# Patient Record
Sex: Female | Born: 1990 | Race: Black or African American | Hispanic: No | Marital: Single | Smoking: Never smoker
Health system: Southern US, Community
[De-identification: ages and names within clinical notes are randomized; demographics above are authoritative.]

## PROBLEM LIST (undated history)

## (undated) DIAGNOSIS — J45909 Unspecified asthma, uncomplicated: Secondary | ICD-10-CM

## (undated) DIAGNOSIS — J069 Acute upper respiratory infection, unspecified: Secondary | ICD-10-CM

## (undated) DIAGNOSIS — L309 Dermatitis, unspecified: Secondary | ICD-10-CM

## (undated) HISTORY — PX: TOE SURGERY: SHX1073

## (undated) HISTORY — DX: Dermatitis, unspecified: L30.9

## (undated) HISTORY — DX: Acute upper respiratory infection, unspecified: J06.9

## (undated) HISTORY — DX: Unspecified asthma, uncomplicated: J45.909

---

## 2011-10-19 HISTORY — PX: ACHILLES TENDON REPAIR: SUR1153

## 2011-11-15 ENCOUNTER — Emergency Department (HOSPITAL_COMMUNITY): Payer: BC Managed Care – PPO

## 2011-11-15 ENCOUNTER — Emergency Department (HOSPITAL_COMMUNITY)
Admission: EM | Admit: 2011-11-15 | Discharge: 2011-11-15 | Disposition: A | Discharge status: Home / Self Care | Payer: BC Managed Care – PPO | Attending: Emergency Medicine | Admitting: Emergency Medicine

## 2011-11-15 ENCOUNTER — Encounter (HOSPITAL_COMMUNITY): Payer: Self-pay | Admitting: *Deleted

## 2011-11-15 DIAGNOSIS — Y9367 Activity, basketball: Secondary | ICD-10-CM | POA: Insufficient documentation

## 2011-11-15 DIAGNOSIS — S99929A Unspecified injury of unspecified foot, initial encounter: Secondary | ICD-10-CM | POA: Insufficient documentation

## 2011-11-15 DIAGNOSIS — R269 Unspecified abnormalities of gait and mobility: Secondary | ICD-10-CM | POA: Insufficient documentation

## 2011-11-15 DIAGNOSIS — M25579 Pain in unspecified ankle and joints of unspecified foot: Secondary | ICD-10-CM | POA: Insufficient documentation

## 2011-11-15 DIAGNOSIS — Y9239 Other specified sports and athletic area as the place of occurrence of the external cause: Secondary | ICD-10-CM | POA: Insufficient documentation

## 2011-11-15 DIAGNOSIS — IMO0001 Reserved for inherently not codable concepts without codable children: Secondary | ICD-10-CM | POA: Insufficient documentation

## 2011-11-15 DIAGNOSIS — S8990XA Unspecified injury of unspecified lower leg, initial encounter: Secondary | ICD-10-CM | POA: Insufficient documentation

## 2011-11-15 DIAGNOSIS — M79669 Pain in unspecified lower leg: Secondary | ICD-10-CM

## 2011-11-15 DIAGNOSIS — M79609 Pain in unspecified limb: Secondary | ICD-10-CM | POA: Insufficient documentation

## 2011-11-15 DIAGNOSIS — X500XXA Overexertion from strenuous movement or load, initial encounter: Secondary | ICD-10-CM | POA: Insufficient documentation

## 2011-11-15 DIAGNOSIS — Y92838 Other recreation area as the place of occurrence of the external cause: Secondary | ICD-10-CM | POA: Insufficient documentation

## 2011-11-15 MED ORDER — HYDROCODONE-ACETAMINOPHEN 5-325 MG PO TABS: 1.0000 | ORAL_TABLET | ORAL | Status: AC | PRN

## 2011-11-15 MED ORDER — HYDROCODONE-ACETAMINOPHEN 5-325 MG PO TABS
1.0000 | ORAL_TABLET | Freq: Once | ORAL | Status: AC
  Administered 2011-11-15: 1 via ORAL
  Filled 2011-11-15: qty 1

## 2011-11-15 MED ORDER — IBUPROFEN 800 MG PO TABS: 800.0000 mg | ORAL_TABLET | Freq: Three times a day (TID) | ORAL | Status: AC

## 2011-11-15 NOTE — ED Notes (Signed)
The pt was playing basketball and she went up for a lay -up and she felt something pop in her lt heel.  Pain and soreness since then

## 2011-11-15 NOTE — ED Provider Notes (Signed)
History     CSN: [id number]  Arrival date & time 11/15/11  2047   First MD Initiated Contact with Patient 11/15/11 2158      Chief Complaint  Patient presents with  . Foot Injury    (Consider location/radiation/quality/duration/timing/severity/associated sxs/prior treatment) Patient is a 21 y.o. female presenting with leg pain. The history is provided by the patient.  Leg Pain  The incident occurred less than 1 hour ago. The incident occurred at the gym. The injury mechanism was a fall. The pain is present in the left leg and left ankle. The quality of the pain is described as sharp and throbbing. Associated symptoms include inability to bear weight. Pertinent negatives include no numbness, no loss of sensation and no tingling.   Pt was playing basketball when she came down on her L foot. She had sudden sharp pain in her L heel but is unsure if she may have gotten kicked in the back of the leg. Has had significant pain to the leg since that time and pain with wt bearing. Feels unable to walk. Denies numbness, tingling, swelling.  History reviewed. No pertinent past medical history.  History reviewed. No pertinent past surgical history.  History reviewed. No pertinent family history.  History  Substance Use Topics  . Smoking status: Never Smoker   . Smokeless tobacco: Not on file  . Alcohol Use: Yes    OB History    Grav Para Term Preterm Abortions TAB SAB Ect Mult Living                  Review of Systems  Constitutional: Negative.   Musculoskeletal: Positive for myalgias and gait problem. Negative for joint swelling.  Skin: Negative for color change.  Neurological: Negative for tingling, weakness and numbness.    Allergies  Review of patient's allergies indicates no known allergies.  Home Medications  No current outpatient prescriptions on file.  BP 134/84  Pulse 81  Temp(Src) 98.4 F (36.9 C) (Oral)  Resp 19  SpO2 100%  LMP 10/15/2011  Physical Exam    Nursing note and vitals reviewed. Constitutional: She is oriented to person, place, and time. She appears well-developed and well-nourished. No distress.  HENT:  Head: Normocephalic and atraumatic.  Musculoskeletal:       Left lower leg: She exhibits tenderness. She exhibits no bony tenderness, no swelling, no edema and no deformity.       Left foot: She exhibits normal range of motion, no tenderness, no bony tenderness, no swelling, normal capillary refill and no deformity.       Negative Thompson test. Achilles intact to palpation. Tender to palp over calf area. No palpable mass/deformity. Pt with reduced plantar flexion of foot although able to do so. Foot neurovasc intact with sensory intact to lt touch. Good DP/PT pulses. Cap refill <3.  Neurological: She is alert and oriented to person, place, and time.  Skin: Skin is warm and dry. She is not diaphoretic.  Psychiatric: She has a normal mood and affect.    ED Course  Procedures (including critical care time)  Labs Reviewed - No data to display Dg Tibia/fibula Left  11/15/2011  *RADIOLOGY REPORT*  Clinical Data: Calf muscle pain radiating down the leg after basketball injury.  LEFT TIBIA AND FIBULA - 2 VIEW  Comparison: Correlation with ankle films obtained at the same time.  Findings: The left tibia and fibula appear intact.  No focal bone lesion or bone destruction.  No evidence of acute  fracture or subluxation.  Bone cortex and trabecular architecture appear intact.  No abnormal radiopaque densities in the soft tissues.  IMPRESSION: No acute bony abnormalities demonstrated.  Original Report Authenticated By: Marlon Pel, M.D.   Dg Ankle Complete Left  11/15/2011  *RADIOLOGY REPORT*  Clinical Data: Left ankle pain and difficulty bearing weight after basketball injury.  LEFT ANKLE COMPLETE - 3+ VIEW  Comparison: None.  Findings: The left ankle appears intact. No evidence of acute fracture or subluxation.  No focal bone lesions.   Bone matrix and cortex appear intact.  No abnormal radiopaque densities in the soft tissues.  Talar dome and ankle mortise appear intact.  IMPRESSION: No acute bony abnormalities identified.  Original Report Authenticated By: Marlon Pel, M.D.  I personally reviewed the plain films.   1. Calf pain       MDM  Pt with pain to calf and ankle since jumping and landing while playing basketball. No palpable deformity and Achilles appears intact on exam. Pt given crutches, ASO and advised to non wt bear for next several days. Advised RICE. Given pain medication. Encouraged to make f/u with ortho for a recheck. Return precautions discussed.        Grant Fontana, [place] 11/16/11 [phone]

## 2011-11-17 NOTE — ED Provider Notes (Signed)
Medical screening examination/treatment/procedure(s) were performed by non-physician practitioner and as supervising physician I was immediately available for consultation/collaboration.   Kalab Camps L Richrd Kuzniar, MD 11/17/11 2305 

## 2012-02-23 IMAGING — CR DG TIBIA/FIBULA 2V*L*
2 series · 2 of 2 positions shown · non-contrast
Comparison: Correlation with ankle films obtained at the same time.

CLINICAL DATA: Calf muscle pain radiating down the leg after
basketball injury.

LEFT TIBIA AND FIBULA - 2 VIEW

[x tib-fib lat left]
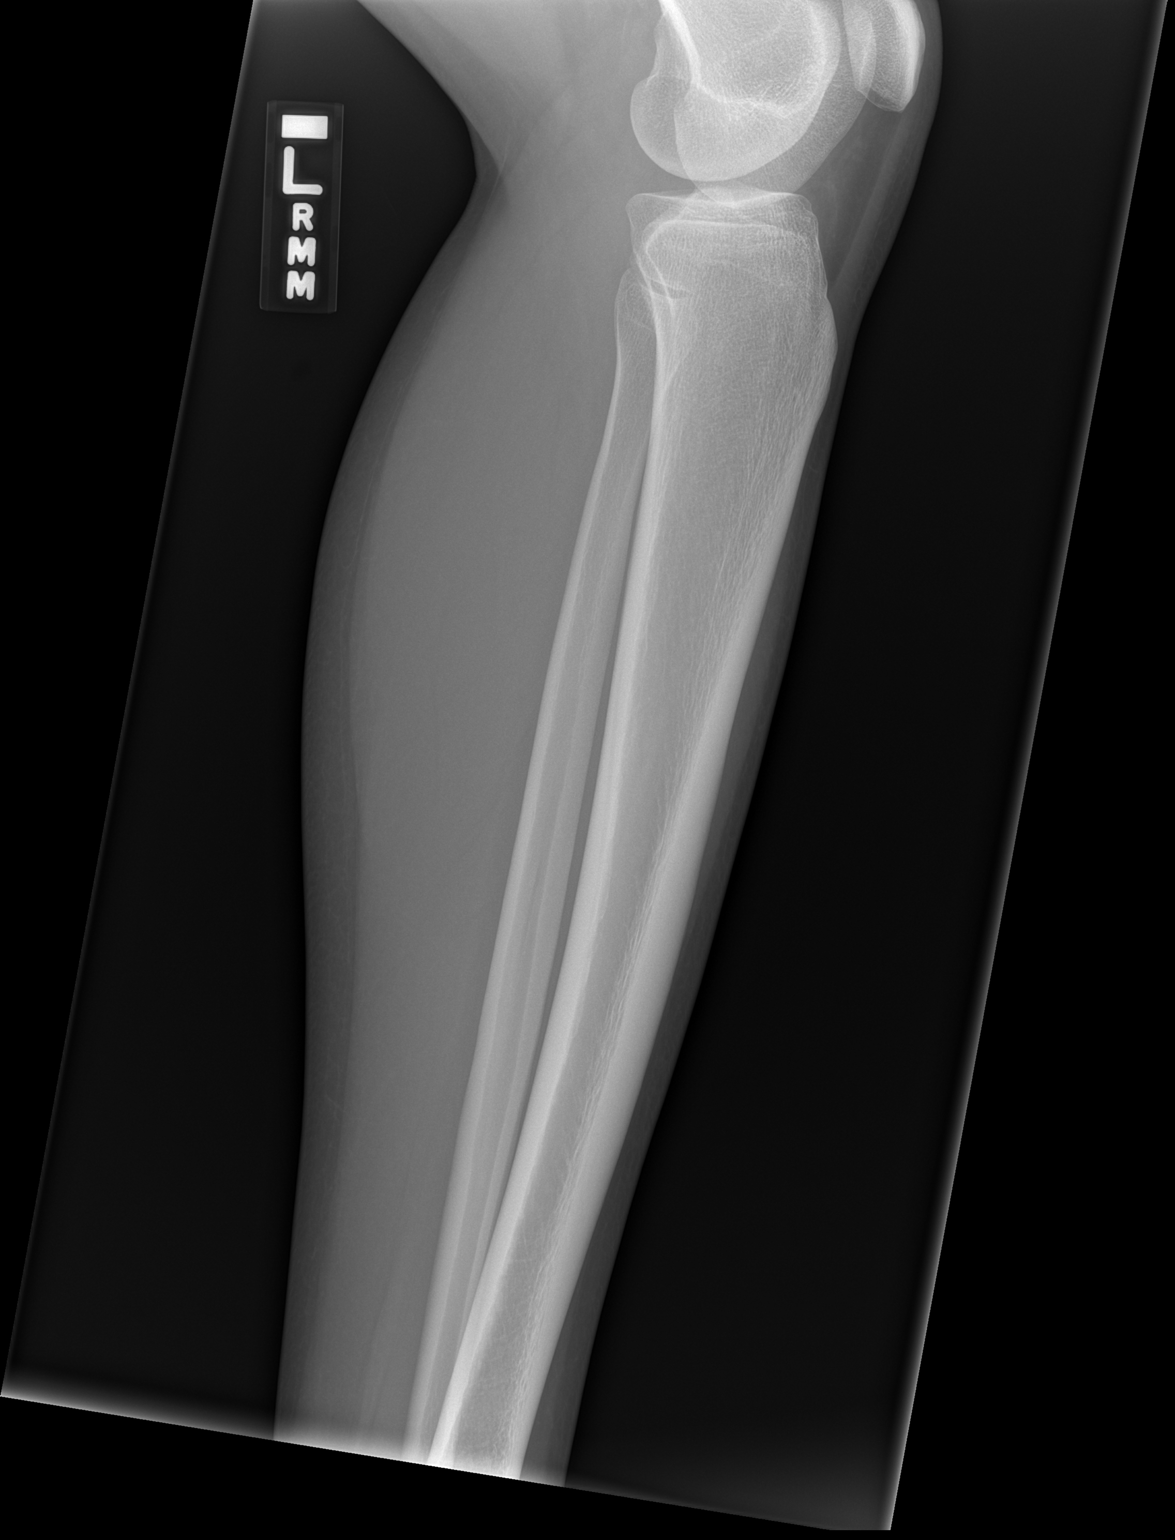

[x tib-fib ap left]
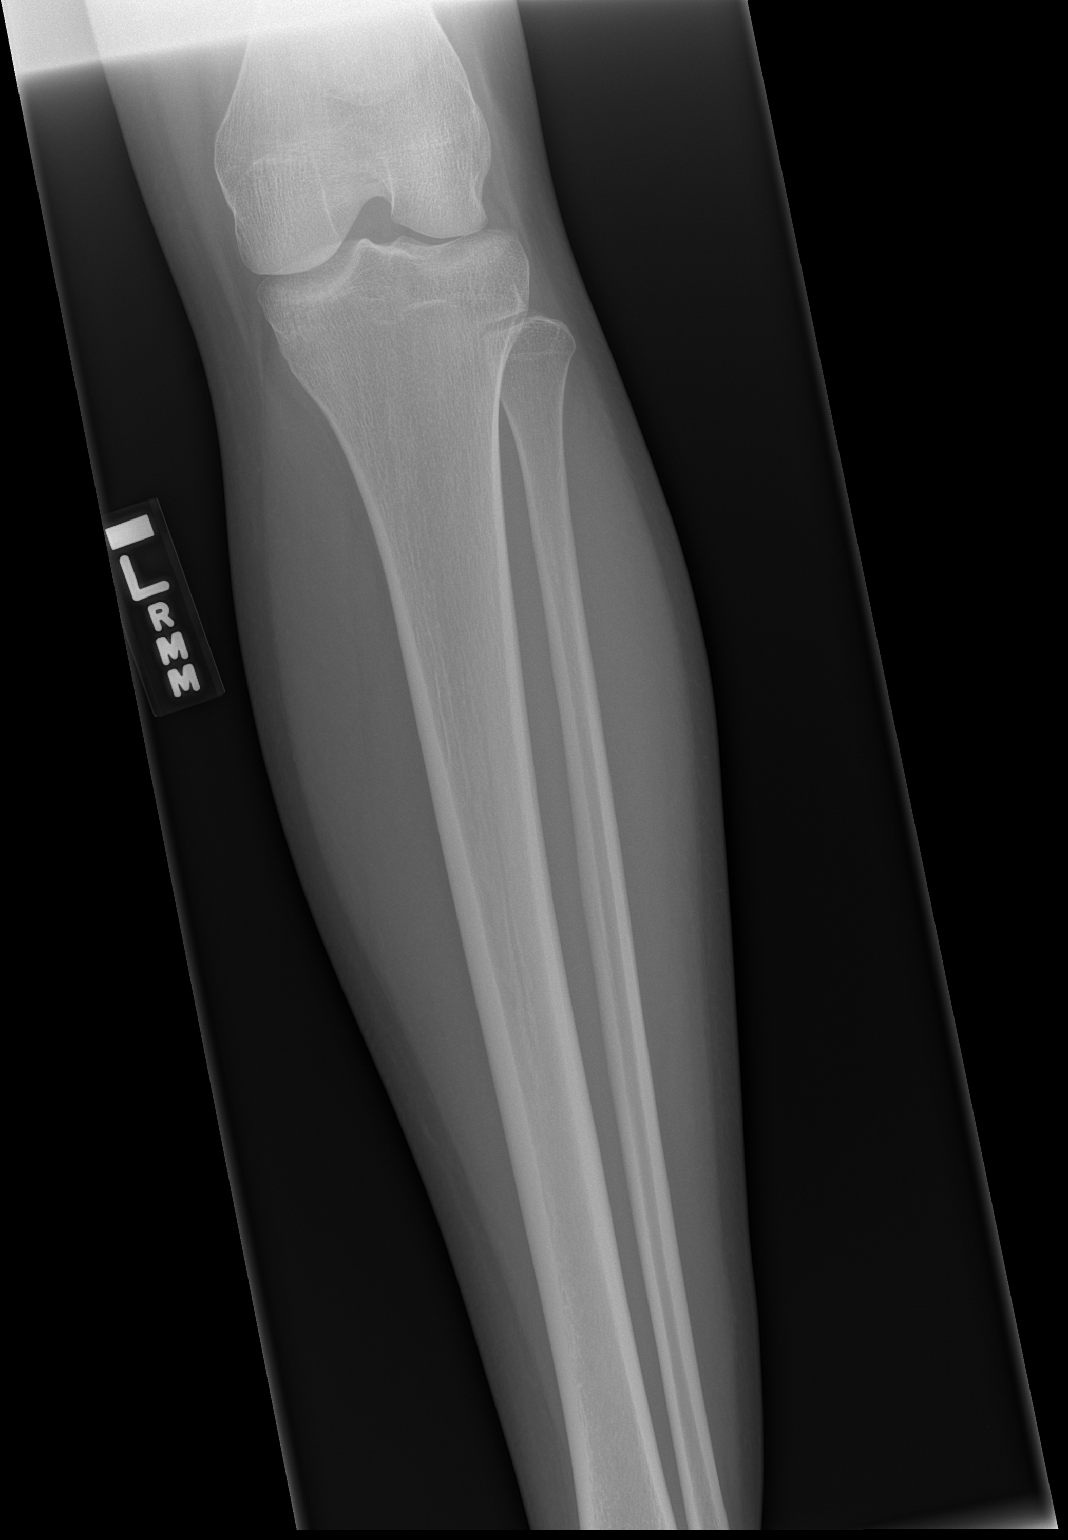

[2 of 2 positions shown; findings below may reference images not displayed]

FINDINGS: The left tibia and fibula appear intact.  No focal bone
lesion or bone destruction.  No evidence of acute fracture or
subluxation.  Bone cortex and trabecular architecture appear
intact.  No abnormal radiopaque densities in the soft tissues.
IMPRESSION: No acute bony abnormalities demonstrated.

## 2013-01-05 ENCOUNTER — Emergency Department (HOSPITAL_COMMUNITY)
Admission: EM | Admit: 2013-01-05 | Discharge: 2013-01-05 | Disposition: A | Discharge status: Home / Self Care | Payer: BC Managed Care – PPO | Source: Home / Self Care | Attending: Emergency Medicine | Admitting: Emergency Medicine

## 2013-01-05 ENCOUNTER — Encounter (HOSPITAL_COMMUNITY): Payer: Self-pay | Admitting: Emergency Medicine

## 2013-01-05 DIAGNOSIS — J039 Acute tonsillitis, unspecified: Secondary | ICD-10-CM

## 2013-01-05 MED ORDER — CEFTRIAXONE SODIUM 1 G IJ SOLR
1.0000 g | Freq: Once | INTRAMUSCULAR | Status: AC
  Administered 2013-01-05: 1 g via INTRAMUSCULAR

## 2013-01-05 MED ORDER — PREDNISONE 20 MG PO TABS
40.0000 mg | ORAL_TABLET | Freq: Once | ORAL | Status: AC
  Administered 2013-01-05: 40 mg via ORAL

## 2013-01-05 MED ORDER — LIDOCAINE HCL (PF) 1 % IJ SOLN
INTRAMUSCULAR | Status: AC
  Filled 2013-01-05: qty 5

## 2013-01-05 MED ORDER — CEFTRIAXONE SODIUM 250 MG IJ SOLR
INTRAMUSCULAR | Status: AC
  Filled 2013-01-05: qty 250

## 2013-01-05 MED ORDER — AMOXICILLIN 875 MG PO TABS: 875.0000 mg | ORAL_TABLET | Freq: Two times a day (BID) | ORAL | Status: DC

## 2013-01-05 MED ORDER — PREDNISONE 20 MG PO TABS
ORAL_TABLET | ORAL | Status: AC
  Filled 2013-01-05: qty 2

## 2013-01-05 NOTE — ED Notes (Signed)
Pt c/o sore throat since yesterday and is gradually getting worse. And is experiencing fatigue.  Pt states that she noticed a white patch on left tonsil x today.  Pt denies any other symptoms. Pt has used cough drops peroxide gargles, salt water and tylenol with mild relief.

## 2013-01-05 NOTE — ED Provider Notes (Signed)
History     CSN: [id number]  Arrival date & time 01/05/13  1911   First MD Initiated Contact with Patient 01/05/13 2037      Chief Complaint  Patient presents with  . Sore Throat    since yesterday    (Consider location/radiation/quality/duration/timing/severity/associated sxs/prior treatment) Patient is a 22 y.o. female presenting with pharyngitis. The history is provided by the patient.  Sore Throat This is a new problem. The current episode started yesterday. The problem occurs constantly. The problem has been gradually worsening. The symptoms are aggravated by swallowing. Nothing relieves the symptoms. Treatments tried: tylenol, peroxide gargles, cough drops. The treatment provided mild relief.    History reviewed. No pertinent past medical history.  History reviewed. No pertinent past surgical history.  History reviewed. No pertinent family history.  History  Substance Use Topics  . Smoking status: Never Smoker   . Smokeless tobacco: Not on file  . Alcohol Use: Yes    OB History   Grav Para Term Preterm Abortions TAB SAB Ect Mult Living                  Review of Systems  Constitutional: Negative for fever and chills.  HENT: Positive for sore throat. Negative for drooling and trouble swallowing.   Respiratory: Negative for stridor.     Allergies  Review of patient's allergies indicates no known allergies.  Home Medications   Current Outpatient Rx  Name  Route  Sig  Dispense  Refill  . amoxicillin (AMOXIL) 875 MG tablet   Oral   Take 1 tablet (875 mg total) by mouth 2 (two) times daily.   20 tablet   0     BP 112/76  Pulse 90  Temp(Src) 98.5 F (36.9 C) (Oral)  Resp 18  SpO2 100%  LMP 12/25/2012  Physical Exam  Constitutional: She appears well-developed and well-nourished. She does not appear ill. No distress.  HENT:  Right Ear: Tympanic membrane, external ear and ear canal normal.  Left Ear: Tympanic membrane, external ear and ear canal  normal.  Nose: Right sinus exhibits no maxillary sinus tenderness and no frontal sinus tenderness. Left sinus exhibits no maxillary sinus tenderness and no frontal sinus tenderness.  Mouth/Throat:    Posterior pharynx not inflamed, no exudate.   Cardiovascular: Normal rate and regular rhythm.   Pulmonary/Chest: Effort normal and breath sounds normal.  Lymphadenopathy:       Head (right side): No submental, no submandibular and no tonsillar adenopathy present.       Head (left side): No submental, no submandibular and no tonsillar adenopathy present.    ED Course  Procedures (including critical care time)  Labs Reviewed  POCT RAPID STREP A ([facility] URG CARE ONLY)   No results found.   1. Tonsillitis       MDM  Dr. Ladon Applebaum examined pt with me.  Concern for possible early peritonsillar abscess.  Given rocephin IM and prednisone here tonight, rx amox.  Pt to return 3/23 for recheck or to ER if sx become severe.         Cathlyn Parsons, NP 01/05/13 2053

## 2013-01-06 NOTE — ED Provider Notes (Signed)
Medical screening examination/treatment/procedure(s) were performed by non-physician practitioner and as supervising physician I was immediately available for consultation/collaboration. Examined the patient myself, symptomatic for less than 24 hours afebrile at this point swallowing with pain but without obstructive or mechanical difficulties. Patient will be rechecked in the next 24-48 hours. I informed patient that was to be here both Saturday and Sunday for recheck was also advised about specific symptoms that should warrant her to go to the emergency department for further evaluation. At this point patient will be followed up closely for the suspicion of a potential early peritonsillar process.  Raynald Blend, MD 01/06/13 1056

## 2013-06-07 ENCOUNTER — Other Ambulatory Visit (HOSPITAL_COMMUNITY)
Admission: RE | Admit: 2013-06-07 | Discharge: 2013-06-07 | Disposition: A | Discharge status: Home / Self Care | Payer: BC Managed Care – PPO | Source: Ambulatory Visit | Attending: Obstetrics and Gynecology | Admitting: Obstetrics and Gynecology

## 2013-06-07 ENCOUNTER — Other Ambulatory Visit: Payer: Self-pay | Admitting: Nurse Practitioner

## 2013-06-07 DIAGNOSIS — Z01419 Encounter for gynecological examination (general) (routine) without abnormal findings: Secondary | ICD-10-CM | POA: Insufficient documentation

## 2013-06-07 DIAGNOSIS — N760 Acute vaginitis: Secondary | ICD-10-CM | POA: Insufficient documentation

## 2013-06-07 DIAGNOSIS — Z113 Encounter for screening for infections with a predominantly sexual mode of transmission: Secondary | ICD-10-CM | POA: Insufficient documentation

## 2014-04-25 ENCOUNTER — Encounter: Payer: Self-pay | Admitting: Podiatry

## 2014-04-25 ENCOUNTER — Ambulatory Visit (INDEPENDENT_AMBULATORY_CARE_PROVIDER_SITE_OTHER): Payer: BC Managed Care – PPO | Admitting: Podiatry

## 2014-04-25 ENCOUNTER — Ambulatory Visit (INDEPENDENT_AMBULATORY_CARE_PROVIDER_SITE_OTHER): Payer: BC Managed Care – PPO

## 2014-04-25 VITALS — BP 115/72 | HR 79 | Resp 16 | Ht 68.0 in | Wt 130.0 lb

## 2014-04-25 DIAGNOSIS — M2010 Hallux valgus (acquired), unspecified foot: Secondary | ICD-10-CM

## 2014-04-25 DIAGNOSIS — M2040 Other hammer toe(s) (acquired), unspecified foot: Secondary | ICD-10-CM

## 2014-04-25 NOTE — Progress Notes (Signed)
   Subjective:    Patient ID: Leah ReamQuanishia Castillo, female    DOB: 11/20/1990, 23 y.o.   MRN: [id number]  HPI Comments: "I have problems with these feet"  Patient c/o tenderness 2nd, 3rd, 4th, 5th toes bilateral for few years. She has been active in basketball for several years. She is developing corns on her toes. Her shoes rub. She is also developing bunions that sometimes look red.  Toe Pain       Review of Systems  Musculoskeletal: Positive for myalgias.  All other systems reviewed and are negative.      Objective:   Physical Exam: I have reviewed her past medical history medications allergies surgeries social history review systems. Pulses are strongly palpable bilateral. Neurologic sensorium is intact per Semmes-Weinstein monofilament. Deep tendon reflexes are brisk and equal bilateral. Muscle strength is 5 over 5 dorsiflexors plantar flexors inverters everters all intrinsic musculature is intact. Orthopedic evaluation demonstrates all joints distal to the ankle a full range of motion without crepitation with exception of her mallet toe deformities toes 2 through 5 bilaterally. She also demonstrates mild hallux valgus deformities bilateral. Radiographic evaluation confirms mallet toe deformities. It also confirms increase in the first intermetatarsal angle. Cutaneous evaluation demonstrates supple well hydrated cutis with exception of reactive postinflammatory hyperpigmentation of the DIPJ bilateral lesser digits.        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  Plan: We discussed the etiology pathology conservative versus surgical therapies. We also discussed appropriate shoe gear stretching exercises ice therapy shoe gear modifications. We also discussed in detail today surgical intervention consisting of arthroplasty to the DIPJ his and hallux abductovalgus repair such as an [employer] bunion  repair. I expressed to her that he would be least 2 weeks out of work for foot for her type of job. I will followup with her in the near future for surgical consult.

## 2014-08-01 ENCOUNTER — Encounter: Payer: Self-pay | Admitting: Podiatry

## 2014-08-01 ENCOUNTER — Ambulatory Visit (INDEPENDENT_AMBULATORY_CARE_PROVIDER_SITE_OTHER): Payer: BC Managed Care – PPO | Admitting: Podiatry

## 2014-08-01 VITALS — BP 112/65 | HR 66 | Resp 16

## 2014-08-01 DIAGNOSIS — M2042 Other hammer toe(s) (acquired), left foot: Secondary | ICD-10-CM

## 2014-08-01 DIAGNOSIS — M2012 Hallux valgus (acquired), left foot: Secondary | ICD-10-CM

## 2014-08-01 NOTE — Patient Instructions (Signed)
Pre-Operative Instructions  Congratulations, you have decided to take an important step to improving your quality of life.  You can be assured that the doctors of [facility] will be with you every step of the way.  1. Plan to be at the surgery center/hospital at least 1 (one) hour prior to your scheduled time unless otherwise directed by the surgical center/hospital staff.  You must have a responsible adult accompany you, remain during the surgery and drive you home.  Make sure you have directions to the surgical center/hospital and know how to get there on time. 2. For hospital based surgery you will need to obtain a history and physical form from your family physician within 1 month prior to the date of surgery- we will give you a form for you primary physician.  3. We make every effort to accommodate the date you request for surgery.  There are however, times where surgery dates or times have to be moved.  We will contact you as soon as possible if a change in schedule is required.   4. No Aspirin/Ibuprofen for one week before surgery.  If you are on aspirin, any non-steroidal anti-inflammatory medications (Mobic, Aleve, Ibuprofen) you should stop taking it 7 days prior to your surgery.  You make take Tylenol  For pain prior to surgery.  5. Medications- If you are taking daily heart and blood pressure medications, seizure, reflux, allergy, asthma, anxiety, pain or diabetes medications, make sure the surgery center/hospital is aware before the day of surgery so they may notify you which medications to take or avoid the day of surgery. 6. No food or drink after midnight the night before surgery unless directed otherwise by surgical center/hospital staff. 7. No alcoholic beverages 24 hours prior to surgery.  No smoking 24 hours prior to or 24 hours after surgery. 8. Wear loose pants or shorts- loose enough to fit over bandages, boots, and casts. 9. No slip on shoes, sneakers are best. 10. Bring  your boot with you to the surgery center/hospital.  Also bring crutches or a walker if your physician has prescribed it for you.  If you do not have this equipment, it will be provided for you after surgery. 11. If you have not been contracted by the surgery center/hospital by the day before your surgery, call to confirm the date and time of your surgery. 12. Leave-time from work may vary depending on the type of surgery you have.  Appropriate arrangements should be made prior to surgery with your employer. 13. Prescriptions will be provided immediately following surgery by your doctor.  Have these filled as soon as possible after surgery and take the medication as directed. 14. Remove nail polish on the operative foot. 15. Wash the night before surgery.  The night before surgery wash the foot and leg well with the antibacterial soap provided and water paying special attention to beneath the toenails and in between the toes.  Rinse thoroughly with water and dry well with a towel.  Perform this wash unless told not to do so by your physician.  Enclosed: 1 Ice pack (please put in freezer the night before surgery)   1 Hibiclens skin cleaner   Pre-op Instructions  If you have any questions regarding the instructions, do not hesitate to call our office.  [place]: [address] [place], [place] [address] [phone]  [place]: [address] [place], [place] [address] [phone]  [place]: [address]  [place], [place] [address] [phone]  Dr. Richard   Tuchman DPM, Dr. Norman Regal DPM Dr. Richard Sikora DPM, Dr. M. Todd Hyatt DPM, Dr. Kathryn Egerton DPM 

## 2014-08-02 NOTE — Progress Notes (Signed)
She presents today for followup of her painful first metatarsophalangeal joint and toes of her left foot. I have reviewed her past medical history medications and allergies she denies denies any changes in her medical history. She states that the painful bunion and hammertoes are directly affecting her ability to perform her daily activities.  Objective: Vital signs are stable she is alert and oriented x3. There is no erythema edema cellulitis drainage or odor only a prominent first metatarsophalangeal joint which is painful on range of motion. An increase in the first intermetatarsal angle is indicative of abductovalgus deformity with lateral deviation of the hallux greater than normal value. She also has mallet toe deformities of the second toe third toe and fourth toes of the left foot. These are painful on range of motion and or more rigid than we would expect for 23 year old female.  Assessment: Hallux valgus left foot. Mallet toe deformity #2 #3 #4 left foot.  Plan: We discussed the etiology pathology conservative versus surgical therapies at this point failure of conservative therapies have resulted in the necessity for surgical intervention. At this point surgical intervention will consist of [facility] with screw fixation left foot. DIPJ arthroplasty toes #2 #3 and #4 of the left foot. We consented her for this today and this was discussed in detail I went over consent today line bylined number by number giving her ample time to ask questions she saw fit regarding these procedures I answered them to the best of my ability in layman's terms. She understood this was amenable to it and signed all 3 pages of the consent form. This will be performed in an outpatient surgical setting in the near future. I will followup with her at that time. We also discussed the possible postop complications which may be but are not limited to postop pain bleeding swelling infection possible digit loss of limb  loss of life over correction under correction need for further surgery.

## 2014-09-03 ENCOUNTER — Other Ambulatory Visit: Payer: Self-pay | Admitting: Podiatry

## 2014-09-03 MED ORDER — PROMETHAZINE HCL 25 MG PO TABS: 25.0000 mg | ORAL_TABLET | Freq: Three times a day (TID) | ORAL | Status: AC | PRN

## 2014-09-03 MED ORDER — OXYCODONE-ACETAMINOPHEN 10-325 MG PO TABS: ORAL_TABLET | ORAL | Status: AC

## 2014-09-03 MED ORDER — CEPHALEXIN 500 MG PO CAPS: 500.0000 mg | ORAL_CAPSULE | Freq: Three times a day (TID) | ORAL | Status: AC

## 2014-09-06 ENCOUNTER — Encounter: Payer: Self-pay | Admitting: Podiatry

## 2014-09-06 DIAGNOSIS — M2012 Hallux valgus (acquired), left foot: Secondary | ICD-10-CM

## 2014-09-06 DIAGNOSIS — M2042 Other hammer toe(s) (acquired), left foot: Secondary | ICD-10-CM

## 2014-09-09 ENCOUNTER — Telehealth: Payer: Self-pay

## 2014-09-09 NOTE — Telephone Encounter (Signed)
Left message for patient to cal with questions or concerns

## 2014-09-10 ENCOUNTER — Encounter: Payer: BC Managed Care – PPO | Admitting: Podiatry

## 2014-09-10 NOTE — Progress Notes (Signed)
Dr Al CorpusHyatt [facility] and hammertoe repair of left 2,3,4 metatarsals on 09/06/14

## 2014-09-17 ENCOUNTER — Ambulatory Visit (INDEPENDENT_AMBULATORY_CARE_PROVIDER_SITE_OTHER): Payer: BC Managed Care – PPO | Admitting: Podiatry

## 2014-09-17 ENCOUNTER — Ambulatory Visit (INDEPENDENT_AMBULATORY_CARE_PROVIDER_SITE_OTHER): Payer: BC Managed Care – PPO

## 2014-09-17 ENCOUNTER — Encounter: Payer: Self-pay | Admitting: Podiatry

## 2014-09-17 VITALS — BP 117/79 | HR 76 | Resp 12

## 2014-09-17 DIAGNOSIS — R52 Pain, unspecified: Secondary | ICD-10-CM

## 2014-09-17 DIAGNOSIS — Z9889 Other specified postprocedural states: Secondary | ICD-10-CM

## 2014-09-17 NOTE — Progress Notes (Signed)
She presents today status post [place] bunion repair left with hammertoe repair #2 #3 number for the left foot she denies fever chills nausea vomiting muscle pains. 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  Objective: Pulses are strongly palpable as a dry sterile dressing was removed there is minimal edema and no erythema cellulitis drainage or odor sutures are intact [employer]a [employer] is remaining well coapted radiographic evaluation demonstrates well-healing capital osteotomy first metatarsal left foot with arthroplasties DIPJ #2 #3 #4. There is no erythema edema saline as drainage or odor everything is in good alignment.  Assessment: Nonsurgical foot status post one week.  Plan: Redress today dressing compressive dressing follow-up with her in 1 week reevaluate x-rays on the right foot and consent her for surgery.

## 2014-09-24 ENCOUNTER — Ambulatory Visit (INDEPENDENT_AMBULATORY_CARE_PROVIDER_SITE_OTHER): Payer: BC Managed Care – PPO | Admitting: Podiatry

## 2014-09-24 VITALS — BP 111/65 | HR 67 | Temp 98.7°F | Resp 16

## 2014-09-24 DIAGNOSIS — Z9889 Other specified postprocedural states: Secondary | ICD-10-CM

## 2014-09-24 DIAGNOSIS — M2011 Hallux valgus (acquired), right foot: Secondary | ICD-10-CM

## 2014-09-24 DIAGNOSIS — M205X1 Other deformities of toe(s) (acquired), right foot: Secondary | ICD-10-CM

## 2014-09-24 DIAGNOSIS — M2041 Other hammer toe(s) (acquired), right foot: Secondary | ICD-10-CM

## 2014-09-24 NOTE — Progress Notes (Signed)
She presents today little more than 2 weeks status post [place] bunion repair left and hammertoe repair #2 #3 #4 of the left foot. She states they seem to be doing well with the foot is a little tender. She would also like to go ahead and sign surgical consent form for the right foot today.  Objective: Also stable she is alert and oriented 3. His are palpable sutures were removed to the surgical sites of the left foot which she tolerated procedure well. At this point I evaluated the right foot which consists of a mild bunion deformity and mallet toe deformities #2 and #3 in the right foot only.  Assessment: Well-healed surgical foot left 2 weeks. Hallux abductovalgus deformity with mallet toe deformities #2 #3 right foot.  Plan: Removal sutures today dispensed a Darco shoe and an anklet. Demonstrated to her how to wrap the toes with Coban. We also went over a consent form today [employer] line number number giving her ample time to ask questions she suffered regarding a McBride bunion repair right foot and a mallet toe repair #2 #3 of the right foot. She saw another patient's consent form I will follow-up with her 10/04/2014.

## 2014-10-03 DIAGNOSIS — M79673 Pain in unspecified foot: Secondary | ICD-10-CM

## 2014-10-04 ENCOUNTER — Encounter: Payer: Self-pay | Admitting: Podiatry

## 2014-10-04 DIAGNOSIS — M2011 Hallux valgus (acquired), right foot: Secondary | ICD-10-CM

## 2014-10-04 DIAGNOSIS — M2041 Other hammer toe(s) (acquired), right foot: Secondary | ICD-10-CM

## 2014-10-08 ENCOUNTER — Encounter: Payer: BC Managed Care – PPO | Admitting: Podiatry

## 2014-10-08 NOTE — Progress Notes (Signed)
Dr Milinda Pointer performed a rfoot Mcbride bunion repair and right met 2,3 mallet toe repair on 10/04/14

## 2014-10-15 ENCOUNTER — Ambulatory Visit (INDEPENDENT_AMBULATORY_CARE_PROVIDER_SITE_OTHER): Payer: BC Managed Care – PPO | Admitting: Podiatry

## 2014-10-15 ENCOUNTER — Ambulatory Visit: Payer: Self-pay

## 2014-10-15 ENCOUNTER — Ambulatory Visit (INDEPENDENT_AMBULATORY_CARE_PROVIDER_SITE_OTHER): Payer: BC Managed Care – PPO

## 2014-10-15 DIAGNOSIS — M2011 Hallux valgus (acquired), right foot: Secondary | ICD-10-CM

## 2014-10-15 DIAGNOSIS — Z9889 Other specified postprocedural states: Secondary | ICD-10-CM

## 2014-10-16 NOTE — Progress Notes (Signed)
She presents today 1 week status post McBride bunion repair and mallet toe repair #2 and #3 of the right foot. She denies fever chills nausea vomiting muscle pains that she states this one seems to be a little more tender than the previous surgery.  Objective: Vital signs are stable she is alert and oriented 3 dry sterile dressing was removed demonstrates no erythema and no edema no cellulitis drainage or odor sutures are intact margins are well coapted and she has a good range of motion of the first metatarsophalangeal joint. Radiographically this appears to be healing perfectly no signs of fracture.  Assessment: Well-healing surgical foot right.  Plan: Redressed today dry sterile compressive dressing follow-up with me in 1 week for suture removal.

## 2014-10-22 ENCOUNTER — Encounter: Payer: BC Managed Care – PPO | Admitting: Podiatry

## 2014-10-22 ENCOUNTER — Ambulatory Visit (INDEPENDENT_AMBULATORY_CARE_PROVIDER_SITE_OTHER): Payer: BLUE CROSS/BLUE SHIELD

## 2014-10-22 DIAGNOSIS — M2011 Hallux valgus (acquired), right foot: Secondary | ICD-10-CM

## 2014-10-22 DIAGNOSIS — M79673 Pain in unspecified foot: Secondary | ICD-10-CM

## 2014-10-22 NOTE — Progress Notes (Signed)
Sutures were removed, wound edges aligned and approximated. Noted small blister on 2nd met, but no drainage. No redness, mild edema noted, but as to be expected from post operative procedure. Advised pt to watch for s/s of infection, further blistering and any drainage. Appointed pt to follow up with Dr Milinda Pointer in 2 weeks or before if problems occur

## 2014-11-05 ENCOUNTER — Ambulatory Visit (INDEPENDENT_AMBULATORY_CARE_PROVIDER_SITE_OTHER): Payer: BLUE CROSS/BLUE SHIELD | Admitting: Podiatry

## 2014-11-05 ENCOUNTER — Ambulatory Visit (INDEPENDENT_AMBULATORY_CARE_PROVIDER_SITE_OTHER): Payer: BLUE CROSS/BLUE SHIELD

## 2014-11-05 DIAGNOSIS — M21611 Bunion of right foot: Secondary | ICD-10-CM

## 2014-11-05 DIAGNOSIS — M2011 Hallux valgus (acquired), right foot: Secondary | ICD-10-CM

## 2014-11-05 DIAGNOSIS — Z9889 Other specified postprocedural states: Secondary | ICD-10-CM

## 2014-11-05 NOTE — Progress Notes (Signed)
She presents today [facility] right foot. She denies fever chills nausea vomiting muscle aches and pains.  Objective: Vital signs are stable she is alert and oriented 3. Pulses are palpable. The surgical foot appears to be healing quite nicely as does the toes area radiographs confirm this.  Assessment: Well-healed surgical foot right.  Plan: Continue all conservative therapies and continue to dress the toes and regular basis follow-up with her in 2-3 weeks

## 2014-12-03 ENCOUNTER — Ambulatory Visit (INDEPENDENT_AMBULATORY_CARE_PROVIDER_SITE_OTHER): Payer: BLUE CROSS/BLUE SHIELD | Admitting: Podiatry

## 2014-12-03 ENCOUNTER — Ambulatory Visit (INDEPENDENT_AMBULATORY_CARE_PROVIDER_SITE_OTHER): Payer: BLUE CROSS/BLUE SHIELD

## 2014-12-03 DIAGNOSIS — Z9889 Other specified postprocedural states: Secondary | ICD-10-CM

## 2014-12-03 DIAGNOSIS — M21611 Bunion of right foot: Secondary | ICD-10-CM

## 2014-12-03 DIAGNOSIS — M2012 Hallux valgus (acquired), left foot: Secondary | ICD-10-CM

## 2014-12-03 DIAGNOSIS — M2011 Hallux valgus (acquired), right foot: Secondary | ICD-10-CM

## 2014-12-03 DIAGNOSIS — M21612 Bunion of left foot: Secondary | ICD-10-CM

## 2014-12-03 NOTE — Progress Notes (Signed)
She presents today follow-up of her bilateral bunion repairs and hammertoe repairs. She still dressing the toes #2 #3 of the right foot. She states that she's doing just great and has no problems whatsoever.  Objective: Vital signs are stable she is alert and oriented 3. There is no erythema edema cellulitis drainage or odor she has great range of motion of the first metatarsophalangeal joint bilateral. Surgical toes bilaterally appear to be healing very well and this is confirmed by radiographs which demonstrate [place] bunion repairs with complete consolidation of the osteotomy sites.  Assessment: Well-healing surgical sites bilateral.  Plan: I encouraged her to continue to massage and wrapped the incision sites of the right foot and I will release her. Follow up with her when necessary

## 2015-06-17 ENCOUNTER — Other Ambulatory Visit (HOSPITAL_COMMUNITY)
Admission: RE | Admit: 2015-06-17 | Discharge: 2015-06-17 | Disposition: A | Discharge status: Home / Self Care | Payer: BLUE CROSS/BLUE SHIELD | Source: Ambulatory Visit | Attending: Nurse Practitioner | Admitting: Nurse Practitioner

## 2015-06-17 ENCOUNTER — Other Ambulatory Visit: Payer: Self-pay | Admitting: Nurse Practitioner

## 2015-06-17 DIAGNOSIS — Z01419 Encounter for gynecological examination (general) (routine) without abnormal findings: Secondary | ICD-10-CM | POA: Insufficient documentation

## 2015-06-17 DIAGNOSIS — Z113 Encounter for screening for infections with a predominantly sexual mode of transmission: Secondary | ICD-10-CM | POA: Insufficient documentation

## 2015-06-19 LAB — CYTOLOGY - PAP

## 2015-09-22 ENCOUNTER — Ambulatory Visit (INDEPENDENT_AMBULATORY_CARE_PROVIDER_SITE_OTHER): Payer: BLUE CROSS/BLUE SHIELD | Admitting: Pediatrics

## 2015-09-22 ENCOUNTER — Encounter: Payer: Self-pay | Admitting: Pediatrics

## 2015-09-22 VITALS — BP 110/72 | HR 76 | Temp 98.3°F | Resp 16 | Ht 68.11 in | Wt 137.6 lb

## 2015-09-22 DIAGNOSIS — J4520 Mild intermittent asthma, uncomplicated: Secondary | ICD-10-CM | POA: Diagnosis not present

## 2015-09-22 DIAGNOSIS — J3081 Allergic rhinitis due to animal (cat) (dog) hair and dander: Secondary | ICD-10-CM

## 2015-09-22 MED ORDER — MONTELUKAST SODIUM 10 MG PO TABS: ORAL_TABLET | ORAL | Status: AC

## 2015-09-22 MED ORDER — FLUTICASONE PROPIONATE 50 MCG/ACT NA SUSP: 2.0000 | Freq: Every day | NASAL | Status: AC | PRN

## 2015-09-22 NOTE — Patient Instructions (Addendum)
Environmental control of dust and mold Ventolin 2 puffs before exercise. She may use Ventolin 2 puffs every 4 hours if needed for wheezing or coughing spells Singulair 10 mg in the morning of the days that she is going to exercise vigorously Cetirizine 10 mg once a day for runny nose if needed Fluticasone 2 sprays per nostril once a day if needed for stuffy nose

## 2015-09-22 NOTE — Progress Notes (Signed)
[address] [place] [address] Dept: [phone]  New Patient Note  Patient ID: Leah ReamQuanishia Castillo, female    DOB: 04/06/1991  Age: 24 y.o. MRN: [id number] Date of Office Visit: 09/22/2015 Referring provider: No referring provider defined for this encounter.    Chief Complaint: Cough; Wheezing; Breathing Problem; Nasal Congestion; and Rash  HPI Leah ReamQuanishia Pursel presents for evaluation of coughing and wheezing and shortness of breath particularly with exercise.. These symptoms began when she was 24 years of age. She has had a runny nose , stuffy nose and sneezing since early childhood. Her symptoms are perennial. She has aggravation of her symptoms on exposure to dust, cigarette smoke and dog. She has used the Ventolin inhaler in the past for coughing and wheezing with exercise   Review of Systems  Constitutional: Negative.   HENT: Positive for congestion (since childhood-perennial).   Eyes: Negative.   Respiratory: Positive for shortness of breath and wheezing (with exercise).   Cardiovascular: Negative.   Gastrointestinal: Negative.   Genitourinary: Negative.   Musculoskeletal: Negative.   Skin: Negative.   Neurological: Negative.   Endo/Heme/Allergies: Positive for environmental allergies (Dust and dog).  Psychiatric/Behavioral: Negative.     Outpatient Encounter Prescriptions as of 09/22/2015  Medication Sig  . albuterol (VENTOLIN HFA) 108 (90 BASE) MCG/ACT inhaler Inhale 1 puff into the lungs every 6 (six) hours as needed for wheezing or shortness of breath (cough).  Lorita Officer. Norgestim-Eth Estrad Triphasic (TRINESSA, 28, PO) Take 1 tablet by mouth daily.  . promethazine (PHENERGAN) 25 MG tablet Take 1 tablet (25 mg total) by mouth every 8 (eight) hours as needed for nausea or vomiting.  . cephALEXin (KEFLEX) 500 MG capsule Take 1 capsule (500 mg total) by mouth 3 (three) times daily. (Patient not taking: Reported on 09/22/2015)  . fluticasone (FLONASE) 50 MCG/ACT nasal  spray Place 2 sprays into both nostrils daily as needed for allergies or rhinitis.  Marland Kitchen. montelukast (SINGULAIR) 10 MG tablet Take one tablet in the morning that she is going to exercise vigoursly.  Marland Kitchen. oxyCODONE-acetaminophen (PERCOCET) 10-325 MG per tablet Take one to two tablets by mouth every six to eight hours as needed for pain. (Patient not taking: Reported on 09/22/2015)  . PRESCRIPTION MEDICATION Birth control   No facility-administered encounter medications on file as of 09/22/2015.     Drug Allergies:  No Known Allergies  Family History: Meerab's family history includes Allergic rhinitis in her father; Diabetes in her maternal grandmother; Eczema in her mother; High Cholesterol in her father and mother; Hypertension in her father; Urticaria in her mother. There is no history of Asthma or Immunodeficiency..  Social. 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 She is trying to get an [facility] [employer]. Her parents have a dog in their house  Physical Exam: BP 110/72 mmHg  Pulse 76  Temp(Src) 98.3 F (36.8 C) (Oral)  Resp 16  Ht 5' 8.11" (1.73 m)  Wt 137 lb 9.1 oz (62.4 kg)  BMI 20.85 kg/m2   Physical Exam  Constitutional: She is oriented to person, place, and time. She appears well-developed and well-nourished.  HENT:  Eyes normal. Ears normal. Nose mild swelling of his turbinates with clear nasal discharge.   Neck: Neck supple. No thyromegaly present.  Cardiovascular:  S1 and S2 normal no murmurs  Pulmonary/Chest:  Clear to percussion and auscultation  Abdominal: Soft. There is no tenderness.  No hepatosplenomegaly  Musculoskeletal: Normal range of motion.  Lymphadenopathy:    She has no cervical adenopathy.  Neurological: She  is alert and oriented to person, place, and time.  Skin:  Clear except for a few tattoos  Psychiatric: She has a normal mood and affect. Her behavior is normal. Judgment and thought content normal.  Vitals reviewed.   Diagnostics Allergy skin tests were  positive to dog and some molds  :FVC    3.39 L FEV1 3.24 L. Predicted FVC 3.74 L predicted FEV1 3.21 L. After albuterol 2 puffs FVC 3.39 L FEV1 3.20 L-spirometry is in the normal range and there was no significant improvement after albuterol Assessment   Assessment and Plan: 1. Mild intermittent asthma, uncomplicated   2. Allergic rhinitis due to animal hair and dander     Meds ordered this encounter  Medications  . montelukast (SINGULAIR) 10 MG tablet    Sig: Take one tablet in the morning that she is going to exercise vigoursly.    Dispense:  30 tablet    Refill:  5  . fluticasone (FLONASE) 50 MCG/ACT nasal spray    Sig: Place 2 sprays into both nostrils daily as needed for allergies or rhinitis.    Dispense:  16 g    Refill:  5    Patient Instructions  Environmental control of dust and mold Ventolin 2 puffs before exercise. She may use Ventolin 2 puffs every 4 hours if needed for wheezing or coughing spells Singulair 10 mg in the morning of the days that she is going to exercise vigorously Cetirizine 10 mg once a day for runny nose if needed Fluticasone 2 sprays per nostril once a day if needed for stuffy nose    Return in about 6 weeks (around 11/03/2015).   Thank you for the opportunity to care for this patient.  Please do not hesitate to contact me with questions.  Tonette Bihari, M.D.  [facility] [address] [place], [place] [address] [phone]
